# Patient Record
Sex: Female | Born: 1965 | Hispanic: No | Marital: Married | Smoking: Never smoker
Health system: Southern US, Community
[De-identification: ages and names within clinical notes are randomized; demographics above are authoritative.]

---

## 1995-09-04 HISTORY — PX: TUBAL LIGATION: SHX77

## 2019-11-13 ENCOUNTER — Ambulatory Visit: Payer: Self-pay | Attending: Internal Medicine

## 2019-11-13 DIAGNOSIS — Z23 Encounter for immunization: Secondary | ICD-10-CM

## 2019-11-13 NOTE — Progress Notes (Signed)
   Covid-19 Vaccination Clinic  Name:  Meagan Golden    MRN: [id number] DOB: 1966-03-20  11/13/2019  [name] was observed post Covid-19 immunization for 15 minutes without incident. She was provided with Vaccine Information Sheet and instruction to access the V-Safe system.   [name] was instructed to call 911 with any severe reactions post vaccine: Marland Kitchen Difficulty breathing  . Swelling of face and throat  . A fast heartbeat  . A bad rash all over body  . Dizziness and weakness   Immunizations Administered    Name Date Dose VIS Date Route   Moderna COVID-19 Vaccine 11/13/2019 11:05 AM 0.5 mL 08/04/2019 Intramuscular   Manufacturer: Moderna   Lot: [id number]   NDC: 46047-998-72

## 2019-12-16 ENCOUNTER — Ambulatory Visit: Payer: Self-pay

## 2019-12-23 ENCOUNTER — Ambulatory Visit: Payer: Self-pay | Attending: Internal Medicine

## 2019-12-23 ENCOUNTER — Ambulatory Visit: Payer: Self-pay

## 2019-12-23 DIAGNOSIS — Z23 Encounter for immunization: Secondary | ICD-10-CM

## 2019-12-23 NOTE — Progress Notes (Signed)
   Covid-19 Vaccination Clinic  Name:  Meagan Golden    MRN: [id number] DOB: Jan 24, 1966  12/23/2019  [name] was observed post Covid-19 immunization for 15 minutes without incident. She was provided with Vaccine Information Sheet and instruction to access the V-Safe system.   [name] was instructed to call 911 with any severe reactions post vaccine: Marland Kitchen Difficulty breathing  . Swelling of face and throat  . A fast heartbeat  . A bad rash all over body  . Dizziness and weakness   Immunizations Administered    Name Date Dose VIS Date Route   Moderna COVID-19 Vaccine 12/23/2019 11:44 AM 0.5 mL 08/2019 Intramuscular   Manufacturer: Moderna   Lot: [id number]   NDC: 97989-211-94

## 2020-07-06 ENCOUNTER — Ambulatory Visit (INDEPENDENT_AMBULATORY_CARE_PROVIDER_SITE_OTHER): Payer: Self-pay | Admitting: Internal Medicine

## 2020-07-07 ENCOUNTER — Other Ambulatory Visit: Payer: Self-pay

## 2020-07-07 ENCOUNTER — Ambulatory Visit (INDEPENDENT_AMBULATORY_CARE_PROVIDER_SITE_OTHER): Payer: 59 | Admitting: Internal Medicine

## 2020-07-07 ENCOUNTER — Encounter (INDEPENDENT_AMBULATORY_CARE_PROVIDER_SITE_OTHER): Payer: Self-pay | Admitting: Internal Medicine

## 2020-07-07 VITALS — BP 121/80 | HR 64 | Temp 97.9°F | Resp 19 | Ht 63.0 in | Wt 153.4 lb

## 2020-07-07 DIAGNOSIS — R5381 Other malaise: Secondary | ICD-10-CM | POA: Diagnosis not present

## 2020-07-07 DIAGNOSIS — Z13220 Encounter for screening for lipoid disorders: Secondary | ICD-10-CM

## 2020-07-07 DIAGNOSIS — Z124 Encounter for screening for malignant neoplasm of cervix: Secondary | ICD-10-CM

## 2020-07-07 DIAGNOSIS — Z131 Encounter for screening for diabetes mellitus: Secondary | ICD-10-CM

## 2020-07-07 DIAGNOSIS — Z1211 Encounter for screening for malignant neoplasm of colon: Secondary | ICD-10-CM

## 2020-07-07 DIAGNOSIS — Z13820 Encounter for screening for osteoporosis: Secondary | ICD-10-CM

## 2020-07-07 DIAGNOSIS — E559 Vitamin D deficiency, unspecified: Secondary | ICD-10-CM

## 2020-07-07 DIAGNOSIS — Z1231 Encounter for screening mammogram for malignant neoplasm of breast: Secondary | ICD-10-CM

## 2020-07-07 DIAGNOSIS — N951 Menopausal and female climacteric states: Secondary | ICD-10-CM

## 2020-07-07 DIAGNOSIS — R5383 Other fatigue: Secondary | ICD-10-CM

## 2020-07-07 NOTE — Progress Notes (Signed)
Metrics: Intervention Frequency ACO  Documented Smoking Status Yearly  Screened one or more times in 24 months  Cessation Counseling or  Active cessation medication Past 24 months  Past 24 months   Guideline developer: UpToDate (See UpToDate for funding source) Date Released: 2014       Wellness Office Visit  Subjective:  Patient ID: Meagan Golden, female    DOB: 02/13/1966  Age: 54 y.o. MRN: [id number]  CC: This 54 year old lady comes to our practice as a new patient to establish care. HPI  She describes fatigue, intermittent hot flashes. She also describes some vague dizziness which she cannot elaborate on.  She denies any chest pain or palpitations.  She is on no medications. Past Surgical History:  Procedure Laterality Date  . TUBAL LIGATION  1997     Family History  Problem Relation Age of Onset  . Diabetes Mother   . Heart disease Father   . Diabetes Brother     Social History   Social History Narrative   Married for 32 years.Lives with [employer].   Social History   Tobacco Use  . Smoking status: Never Smoker  . Smokeless tobacco: Never Used  Substance Use Topics  . Alcohol use: Never    No outpatient medications have been marked as taking for the 07/07/20 encounter (Office Visit) with Wilson Singer, MD.      No flowsheet data found.   Objective:   Today's Vitals: BP 121/80 (BP Location: Right Arm, Patient Position: Sitting, Cuff Size: Normal)   Pulse 64   Temp 97.9 F (36.6 C) (Temporal)   Resp 19   Ht 5\' 3"  (1.6 m)   Wt 153 lb 6.4 oz (69.6 kg)   SpO2 97%   BMI 27.17 kg/m  Vitals with BMI 07/07/2020  Height 5\' 3"   Weight 153 lbs 6 oz  BMI 27.18  Systolic 121  Diastolic 80  Pulse 64     Physical Exam  She is overweight.  Blood pressure is acceptable.  Alert and orientated without any obvious focal neurological signs.     Assessment   1. Malaise and fatigue   2. Vitamin D deficiency disease   3. Colon cancer  screening   4. Encounter for screening mammogram for malignant neoplasm of breast   5. Osteoporosis screening   6. Screening for diabetes mellitus   7. Screening for lipoid disorders   8. Hot flashes due to menopause   9. Cervical cancer screening       Tests ordered Orders Placed This Encounter  Procedures  . MM 3D SCREEN BREAST BILATERAL  . DG Bone Density  . CBC  . COMPLETE METABOLIC PANEL WITH GFR  . Hemoglobin A1c  . Lipid panel  . Progesterone  . Estradiol  . T3, free  . T4  . TSH  . VITAMIN D 25 Hydroxy (Vit-D Deficiency, Fractures)  . Ambulatory referral to Gastroenterology  . Ambulatory referral to Obstetrics / Gynecology     Plan: 1. Blood work is ordered. 2. I will arrange for her to have a mammogram, bone density scan and colonoscopy as well as Pap smear. 3. Follow-up in a few weeks time to discuss all these results.   No orders of the defined types were placed in this encounter.   13/12/2019, MD

## 2020-07-08 LAB — COMPLETE METABOLIC PANEL WITH GFR
AG Ratio: 1.5 (calc) (ref 1.0–2.5)
ALT: 24 U/L (ref 6–29)
AST: 23 U/L (ref 10–35)
Albumin: 4.7 g/dL (ref 3.6–5.1)
Alkaline phosphatase (APISO): 85 U/L (ref 37–153)
BUN: 9 mg/dL (ref 7–25)
CO2: 27 mmol/L (ref 20–32)
Calcium: 10 mg/dL (ref 8.6–10.4)
Chloride: 102 mmol/L (ref 98–110)
Creat: 0.8 mg/dL (ref 0.50–1.05)
GFR, Est African American: 96 mL/min/{1.73_m2} (ref >=60)
GFR, Est Non African American: 83 mL/min/{1.73_m2} (ref >=60)
Globulin: 3.1 g/dL (calc) (ref 1.9–3.7)
Glucose, Bld: 90 mg/dL (ref 65–99)
Potassium: 4.6 mmol/L (ref 3.5–5.3)
Sodium: 139 mmol/L (ref 135–146)
Total Bilirubin: 0.4 mg/dL (ref 0.2–1.2)
Total Protein: 7.8 g/dL (ref 6.1–8.1)

## 2020-07-08 LAB — CBC
HCT: 40.4 % (ref 35.0–45.0)
Hemoglobin: 13.7 g/dL (ref 11.7–15.5)
MCH: 30.2 pg (ref 27.0–33.0)
MCHC: 33.9 g/dL (ref 32.0–36.0)
MCV: 89 fL (ref 80.0–100.0)
MPV: 11.9 fL (ref 7.5–12.5)
Platelets: 293 10*3/uL (ref 140–400)
RBC: 4.54 10*6/uL (ref 3.80–5.10)
RDW: 12.3 % (ref 11.0–15.0)
WBC: 8 10*3/uL (ref 3.8–10.8)

## 2020-07-08 LAB — LIPID PANEL
Cholesterol: 249 mg/dL — ABNORMAL HIGH (ref <200)
HDL: 34 mg/dL — ABNORMAL LOW (ref >=50)
LDL Cholesterol (Calc): 161 mg/dL (calc) — ABNORMAL HIGH
Non-HDL Cholesterol (Calc): 215 mg/dL (calc) — ABNORMAL HIGH (ref <130)
Total CHOL/HDL Ratio: 7.3 (calc) — ABNORMAL HIGH (ref <5.0)
Triglycerides: 353 mg/dL — ABNORMAL HIGH (ref <150)

## 2020-07-08 LAB — PROGESTERONE: Progesterone: <0.5 ng/mL

## 2020-07-08 LAB — HEMOGLOBIN A1C
Hgb A1c MFr Bld: 6.4 % of total Hgb — ABNORMAL HIGH (ref <5.7)
Mean Plasma Glucose: 137 (calc)
eAG (mmol/L): 7.6 (calc)

## 2020-07-08 LAB — TSH: TSH: 4.1 mIU/L

## 2020-07-08 LAB — T3, FREE: T3, Free: 3 pg/mL (ref 2.3–4.2)

## 2020-07-08 LAB — VITAMIN D 25 HYDROXY (VIT D DEFICIENCY, FRACTURES): Vit D, 25-Hydroxy: 26 ng/mL — ABNORMAL LOW (ref 30–100)

## 2020-07-08 LAB — T4: T4, Total: 9.6 ug/dL (ref 5.1–11.9)

## 2020-07-08 LAB — ESTRADIOL: Estradiol: <15 pg/mL

## 2020-07-19 ENCOUNTER — Encounter: Payer: Self-pay | Admitting: Internal Medicine

## 2020-08-22 ENCOUNTER — Ambulatory Visit (INDEPENDENT_AMBULATORY_CARE_PROVIDER_SITE_OTHER): Payer: 59 | Admitting: Internal Medicine

## 2020-08-22 ENCOUNTER — Telehealth (INDEPENDENT_AMBULATORY_CARE_PROVIDER_SITE_OTHER): Payer: 59 | Admitting: Internal Medicine

## 2020-09-01 ENCOUNTER — Other Ambulatory Visit: Payer: Self-pay

## 2020-09-01 ENCOUNTER — Encounter: Payer: Self-pay | Admitting: Internal Medicine

## 2020-09-01 ENCOUNTER — Ambulatory Visit: Payer: 59

## 2020-09-06 ENCOUNTER — Other Ambulatory Visit: Payer: Self-pay

## 2020-09-06 ENCOUNTER — Other Ambulatory Visit (HOSPITAL_COMMUNITY)
Admission: RE | Admit: 2020-09-06 | Discharge: 2020-09-06 | Disposition: A | Discharge status: Home / Self Care | Payer: 59 | Source: Ambulatory Visit | Attending: Adult Health | Admitting: Adult Health

## 2020-09-06 ENCOUNTER — Encounter: Payer: Self-pay | Admitting: Adult Health

## 2020-09-06 ENCOUNTER — Ambulatory Visit (INDEPENDENT_AMBULATORY_CARE_PROVIDER_SITE_OTHER): Payer: 59 | Admitting: Adult Health

## 2020-09-06 VITALS — BP 113/73 | HR 89 | Ht 63.0 in | Wt 155.0 lb

## 2020-09-06 DIAGNOSIS — Z1211 Encounter for screening for malignant neoplasm of colon: Secondary | ICD-10-CM | POA: Insufficient documentation

## 2020-09-06 DIAGNOSIS — N816 Rectocele: Secondary | ICD-10-CM | POA: Diagnosis not present

## 2020-09-06 DIAGNOSIS — Z01419 Encounter for gynecological examination (general) (routine) without abnormal findings: Secondary | ICD-10-CM | POA: Insufficient documentation

## 2020-09-06 DIAGNOSIS — R140 Abdominal distension (gaseous): Secondary | ICD-10-CM | POA: Diagnosis not present

## 2020-09-06 LAB — HEMOCCULT GUIAC POC 1CARD (OFFICE): Fecal Occult Blood, POC: NEGATIVE

## 2020-09-06 NOTE — Progress Notes (Signed)
Patient ID: Meagan Golden, female   DOB: 01/02/1966, 55 y.o.   MRN: [id number] History of Present Illness: Meagan Golden is a 55 year old [place] female, married, PM in for a pelvic and pap. She had labs and physical with PCP. Her daughter is with her. PCP is Dr Karilyn Cota.   Current Medications, Allergies, Past Medical History, Past Surgical History, Family History and Social History were reviewed in [employer] record.     Review of Systems: Patient denies any abdominal pain, problems with bowel movements, urination, or intercourse,or vaginal bleeding since stopping period. No joint pain or mood swings. +bloating,thinks stomach feels bigger below the navel   Physical Exam:BP 113/73 (BP Location: Left Arm, Patient Position: Sitting, Cuff Size: Normal)   Pulse 89   Ht 5\' 3"  (1.6 m)   Wt 155 lb (70.3 kg)   BMI 27.46 kg/m  General:  Well developed, well nourished, no acute distress Skin:  Warm and dry Lungs; Clear to auscultation bilaterally Cardiovascular: Regular rate and rhythm Pelvic:  External genitalia is normal in appearance, no lesions.  The vagina is pale with loss of moisture and rugae. Urethra has no lesions or masses. The cervix is smooth, pap with HRHPV genotyping performed. Uterus is felt to be normal size, shape, and contour.  No adnexal masses or tenderness noted.Bladder is non tender, no masses felt. Rectal: Good sphincter tone, no polyps, or hemorrhoids felt.  Hemoccult negative.+rectocele Extremities/musculoskeletal:  No swelling or varicosities noted, no clubbing or cyanosis Psych:  No mood changes, alert and cooperative,seems happy AA is 0  Fall risk is low PHQ 9 score is 1 GAD 7 score is 0  Upstream - 09/06/20 1447      Pregnancy Intention Screening   Does the patient want to become pregnant in the next year? N/A    Does the patient's partner want to become pregnant in the next year? N/A    Would the patient like to discuss contraceptive options  today? N/A      Contraception Wrap Up   Current Method Female Sterilization   PM   End Method Female Sterilization   PM   Contraception Counseling Provided No         Examination chaperoned by 11/04/20 LPN  Impression and Plan: 1. Encounter for gynecological examination with Papanicolaou smear of cervix Pap sent Pap in 3 years if normal Physical and labs with PCP Scheduled mammogram for her 09/19/20 at 6:15 pm at [facility] Call PCP regarding labs  2. Encounter for screening fecal occult blood testing  3. Bloating Will get pelvic [facility] to assess bloating, 09/19/20 at 3:30 pm at [facility]   4. Rectocele

## 2020-09-07 ENCOUNTER — Encounter (INDEPENDENT_AMBULATORY_CARE_PROVIDER_SITE_OTHER): Payer: Self-pay | Admitting: Internal Medicine

## 2020-09-07 ENCOUNTER — Ambulatory Visit (INDEPENDENT_AMBULATORY_CARE_PROVIDER_SITE_OTHER): Payer: 59 | Admitting: Internal Medicine

## 2020-09-07 VITALS — BP 120/74 | HR 86 | Temp 97.3°F | Ht 63.0 in | Wt 156.8 lb

## 2020-09-07 DIAGNOSIS — R5381 Other malaise: Secondary | ICD-10-CM | POA: Diagnosis not present

## 2020-09-07 DIAGNOSIS — R5383 Other fatigue: Secondary | ICD-10-CM

## 2020-09-07 DIAGNOSIS — R7303 Prediabetes: Secondary | ICD-10-CM | POA: Diagnosis not present

## 2020-09-07 DIAGNOSIS — E785 Hyperlipidemia, unspecified: Secondary | ICD-10-CM | POA: Diagnosis not present

## 2020-09-07 DIAGNOSIS — E559 Vitamin D deficiency, unspecified: Secondary | ICD-10-CM

## 2020-09-07 MED ORDER — NP THYROID 60 MG PO TABS: 60.0000 mg | ORAL_TABLET | Freq: Every day | ORAL | 3 refills | Status: DC

## 2020-09-07 NOTE — Progress Notes (Signed)
Metrics: Intervention Frequency ACO  Documented Smoking Status Yearly  Screened one or more times in 24 months  Cessation Counseling or  Active cessation medication Past 24 months  Past 24 months   Guideline developer: UpToDate (See UpToDate for funding source) Date Released: 2014       Wellness Office Visit  Subjective:  Patient ID: Meagan Golden, female    DOB: 26-May-1966  Age: 55 y.o. MRN: [id number]  CC: This lady comes in for follow-up regarding her blood work and further recommendations. HPI  I discussed all her blood results with her.  She has significant vitamin D deficiency.  She is prediabetic with a hemoglobin A1c of 6.4%.  She also has dyslipidemia with elevated cholesterol and triglycerides.  As expected estradiol and progesterone levels are undetectable.  Also her TSH is elevated above 4, although technically in the normal range.  She does appear to have some symptoms of thyroid hypofunction. History reviewed. No pertinent past medical history. Past Surgical History:  Procedure Laterality Date  . TUBAL LIGATION  1997     Family History  Problem Relation Age of Onset  . Diabetes Mother   . Heart disease Father   . Diabetes Brother     Social History   Social History Narrative   Married for 32 years.Lives with [employer].   Social History   Tobacco Use  . Smoking status: Never Smoker  . Smokeless tobacco: Never Used  Substance Use Topics  . Alcohol use: Never    Current Meds  Medication Sig  . NP THYROID 60 MG tablet Take 1 tablet (60 mg total) by mouth daily before breakfast.      Depression screen [facility] 2/9 09/06/2020  Decreased Interest 0  Down, Depressed, Hopeless 0  PHQ - 2 Score 0  Altered sleeping 0  Tired, decreased energy 0  Change in appetite 1  Feeling bad or failure about yourself  0  Trouble concentrating 0  Moving slowly or fidgety/restless 0  Suicidal thoughts 0  PHQ-9 Score 1     Objective:   Today's Vitals: BP  120/74 (BP Location: Left Arm, Patient Position: Sitting, Cuff Size: Normal)   Pulse 86   Temp (!) 97.3 F (36.3 C) (Temporal)   Ht 5\' 3"  (1.6 m)   Wt 156 lb 12.8 oz (71.1 kg)   SpO2 98%   BMI 27.78 kg/m  Vitals with BMI 09/07/2020 09/06/2020 07/07/2020  Height 5\' 3"  5\' 3"  5\' 3"   Weight 156 lbs 13 oz 155 lbs 153 lbs 6 oz  BMI 27.78 27.46 27.18  Systolic 120 113 13/12/2019  Diastolic 74 73 80  Pulse 86 89 64     Physical Exam   She looks systemically well.  No new physical findings today.    Assessment   1. Vitamin D deficiency disease   2. Malaise and fatigue   3. Prediabetes   4. Dyslipidemia       Tests ordered No orders of the defined types were placed in this encounter.    Plan: 1. I recommend that she start taking vitamin D3 10,000 units daily. 2. After reviewing her symptoms, with shared decision making, I am going to prescribe for her desiccated NP thyroid, off label, for symptoms of thyroid hypofunction. 3. We discussed nutrition in detail to prevent her becoming diabetes and also helping her dyslipidemia by reducing visceral fat.  I discussed the concept of intermittent fasting combined with a plant-based diet.  Fortunately, she does not eat any animal  protein whatsoever and this will make it somewhat easier for her to follow guidelines.  In her diet there is a lot of white rice and white flour which she needs to reduce also. 4. Follow-up in about 2 months to see how she is doing and we will repeat some blood work.   Meds ordered this encounter  Medications  . NP THYROID 60 MG tablet    Sig: Take 1 tablet (60 mg total) by mouth daily before breakfast.    Dispense:  30 tablet    Refill:  3    Marchetta Navratil Normajean Glasgow, MD

## 2020-09-07 NOTE — Patient Instructions (Signed)
[name] Dietary Recommendations for Weight Loss What to Avoid . Avoid added sugars o Often added sugar can be found in processed foods such as many condiments, dry cereals, cakes, cookies, chips, crisps, crackers, candies, sweetened drinks, etc.  o Read labels and AVOID/DECREASE use of foods with the following in their ingredient list: Sugar, fructose, high fructose corn syrup, sucrose, glucose, maltose, dextrose, molasses, cane sugar, brown sugar, any type of syrup, agave nectar, etc.   . Avoid snacking in between meals . Avoid foods made with flour o If you are going to eat food made with flour, choose those made with whole-grains; and, minimize your consumption as much as is tolerable . Avoid processed foods o These foods are generally stocked in the middle of the grocery store. Focus on shopping on the perimeter of the grocery.  . Avoid Meat  o We recommend following a plant-based diet at [facility]. Thus, we recommend avoiding meat as a general rule. Consider eating beans, legumes, eggs, and/or dairy products for regular protein sources o If you plan on eating meat limit to 4 ounces of meat at a time and choose lean options such as Fish, chicken, turkey. Avoid red meat intake such as pork and/or steak What to Include . Vegetables o GREEN LEAFY VEGETABLES: Kale, spinach, mustard greens, collard greens, cabbage, broccoli, etc. o OTHER: Asparagus, cauliflower, eggplant, carrots, peas, Brussel sprouts, tomatoes, bell peppers, zucchini, beets, cucumbers, etc. . Grains, seeds, and legumes o Beans: kidney beans, black eyed peas, garbanzo beans, black beans, pinto beans, etc. o Whole, unrefined grains: brown rice, barley, bulgur, oatmeal, etc. . Healthy fats  o Avoid highly processed fats such as vegetable oil o Examples of healthy fats: avocado, olives, virgin olive oil, dark chocolate (?72% Cocoa), nuts (peanuts, almonds, walnuts, cashews, pecans, etc.) . None to Low  Intake of Animal Sources of Protein o Meat sources: chicken, turkey, salmon, tuna. Limit to 4 ounces of meat at one time. o Consider limiting dairy sources, but when choosing dairy focus on: PLAIN Greek yogurt, cottage cheese, high-protein milk . Fruit o Choose berries  When to Eat . Intermittent Fasting: o Choosing not to eat for a specific time period, but DO FOCUS ON HYDRATION when fasting o Multiple Techniques: - Time Restricted Eating: eat 3 meals in a day, each meal lasting no more than 60 minutes, no snacks between meals - 16-18 hour fast: fast for 16 to 18 hours up to 7 days a week. Often suggested to start with 2-3 nonconsecutive days per week.  . Remember the time you sleep is counted as fasting.  . Examples of eating schedule: Fast from 7:00pm-11:00am. Eat between 11:00am-7:00pm.  - 24-hour fast: fast for 24 hours up to every other day. Often suggested to start with 1 day per week . Remember the time you sleep is counted as fasting . Examples of eating schedule:  o Eating day: eat 2-3 meals on your eating day. If doing 2 meals, each meal should last no more than 90 minutes. If doing 3 meals, each meal should last no more than 60 minutes. Finish last meal by 7:00pm. o Fasting day: Fast until 7:00pm.  o IF YOU FEEL UNWELL FOR ANY REASON/IN ANY WAY WHEN FASTING, STOP FASTING BY EATING A NUTRITIOUS SNACK OR LIGHT MEAL o ALWAYS FOCUS ON HYDRATION DURING FASTS - Acceptable Hydration sources: water, broths, tea/coffee (black tea/coffee is best but using a small amount of whole-fat dairy products in coffee/tea is acceptable).  -   Poor Hydration Sources: anything with sugar or artificial sweeteners added to it  These recommendations have been developed for patients that are actively receiving medical care from either Dr. Adonna Horsley or Sarah Gray, DNP, NP-C at [facility]. These recommendations are developed for patients with specific medical conditions and are not meant to be  distributed or used by others that are not actively receiving care from either provider listed above at [facility]. It is not appropriate to participate in the above eating plans without proper medical supervision.   Reference: Fung, J. The obesity code. [place]: [place]; 2016.   

## 2020-09-11 LAB — CYTOLOGY - PAP
Comment: NEGATIVE
Diagnosis: NEGATIVE
High risk HPV: NEGATIVE

## 2020-09-19 ENCOUNTER — Ambulatory Visit (HOSPITAL_COMMUNITY)
Admission: RE | Admit: 2020-09-19 | Discharge: 2020-09-19 | Disposition: A | Discharge status: Home / Self Care | Payer: 59 | Source: Ambulatory Visit | Attending: Internal Medicine | Admitting: Internal Medicine

## 2020-09-19 ENCOUNTER — Ambulatory Visit (HOSPITAL_COMMUNITY): Payer: 59

## 2020-09-19 ENCOUNTER — Ambulatory Visit (HOSPITAL_COMMUNITY)
Admission: RE | Admit: 2020-09-19 | Discharge: 2020-09-19 | Disposition: A | Discharge status: Home / Self Care | Payer: 59 | Source: Ambulatory Visit | Attending: Adult Health | Admitting: Adult Health

## 2020-09-19 ENCOUNTER — Other Ambulatory Visit: Payer: Self-pay

## 2020-09-19 DIAGNOSIS — R140 Abdominal distension (gaseous): Secondary | ICD-10-CM | POA: Diagnosis present

## 2020-09-19 DIAGNOSIS — Z1231 Encounter for screening mammogram for malignant neoplasm of breast: Secondary | ICD-10-CM | POA: Diagnosis present

## 2020-09-29 ENCOUNTER — Telehealth (INDEPENDENT_AMBULATORY_CARE_PROVIDER_SITE_OTHER): Payer: Self-pay

## 2020-09-29 NOTE — Telephone Encounter (Signed)
He is talking about NP thyroid I presume.  The patient should stop the medication until the next visit.

## 2020-09-29 NOTE — Telephone Encounter (Signed)
RETURN CALL TO SON, GIVEN INSTRUCTIONS TO STOP MEDICATIONS. ASK TO RESCHEDULE A APPT TO COME SEE HIM SOONER. MOVED APPT TO 10/12/20 @ 1PM.

## 2020-10-12 ENCOUNTER — Ambulatory Visit (INDEPENDENT_AMBULATORY_CARE_PROVIDER_SITE_OTHER): Payer: 59 | Admitting: Internal Medicine

## 2020-11-07 ENCOUNTER — Encounter (INDEPENDENT_AMBULATORY_CARE_PROVIDER_SITE_OTHER): Payer: Self-pay | Admitting: Internal Medicine

## 2020-11-07 ENCOUNTER — Ambulatory Visit (INDEPENDENT_AMBULATORY_CARE_PROVIDER_SITE_OTHER): Payer: 59 | Admitting: Internal Medicine

## 2020-11-07 ENCOUNTER — Other Ambulatory Visit: Payer: Self-pay

## 2020-11-07 VITALS — BP 118/76 | HR 87 | Temp 97.5°F | Ht 63.0 in | Wt 140.2 lb

## 2020-11-07 DIAGNOSIS — E785 Hyperlipidemia, unspecified: Secondary | ICD-10-CM | POA: Diagnosis not present

## 2020-11-07 DIAGNOSIS — R7303 Prediabetes: Secondary | ICD-10-CM

## 2020-11-07 DIAGNOSIS — E559 Vitamin D deficiency, unspecified: Secondary | ICD-10-CM | POA: Diagnosis not present

## 2020-11-07 NOTE — Progress Notes (Signed)
Metrics: Intervention Frequency ACO  Documented Smoking Status Yearly  Screened one or more times in 24 months  Cessation Counseling or  Active cessation medication Past 24 months  Past 24 months   Guideline developer: UpToDate (See UpToDate for funding source) Date Released: 2014       Wellness Office Visit  Subjective:  Patient ID: Meagan Golden, female    DOB: [birth year]  Age: 55 y.o. MRN: [id number]  CC: This lady comes in for follow-up of prediabetes, dyslipidemia, vitamin D deficiency and thyroid hypofunction. HPI  She has done extremely well with nutritional changes.  She is eating healthier and has a result has lost approximately 15 pounds. She has tolerated NP thyroid 60 mg daily which is being used off label for symptoms of thyroid hypofunction. She has been taking vitamin D3 10,000 units daily for vitamin D deficiency. History reviewed. No pertinent past medical history. Past Surgical History:  Procedure Laterality Date  . TUBAL LIGATION  1997     Family History  Problem Relation Age of Onset  . Diabetes Mother   . Heart disease Father   . Diabetes Brother     Social History   Social History Narrative   Married for 32 years.Lives with [employer].   Social History   Tobacco Use  . Smoking status: Never Smoker  . Smokeless tobacco: Never Used  Substance Use Topics  . Alcohol use: Never    Current Meds  Medication Sig  . Cholecalciferol (VITAMIN D3) 125 MCG (5000 UT) TABS Take 2 tablets by mouth daily at 12 noon.  . NP THYROID 60 MG tablet Take 1 tablet (60 mg total) by mouth daily before breakfast.     Flowsheet Row Office Visit from 11/07/2020 in [facility]  PHQ-9 Total Score 0      Objective:   Today's Vitals: BP 118/76   Pulse 87   Temp (!) 97.5 F (36.4 C) (Temporal)   Ht 5\' 3"  (1.6 m)   Wt 140 lb 3.2 oz (63.6 kg)   SpO2 97%   BMI 24.84 kg/m  Vitals with BMI 11/07/2020 09/07/2020 09/06/2020  Height 5\' 3"  5\' 3"  5\' 3"    Weight 140 lbs 3 oz 156 lbs 13 oz 155 lbs  BMI 24.84 27.78 27.46  Systolic 118 120 11/04/2020  Diastolic 76 74 73  Pulse 87 86 89     Physical Exam She looks systemically well and has lost 16 pounds since last visit.  This is excellent.      Assessment   1. Prediabetes   2. Dyslipidemia   3. Vitamin D deficiency disease       Tests ordered Orders Placed This Encounter  Procedures  . COMPLETE METABOLIC PANEL WITH GFR  . Hemoglobin A1c  . Lipid panel  . VITAMIN D 25 Hydroxy (Vit-D Deficiency, Fractures)  . T3, free  . TSH     Plan: 1. She will continue with NP thyroid 60 mg daily which she is tolerating well.  Check thyroid function today. 2. She will continue with vitamin D3 10,000 units daily and we will check vitamin D levels today. 3. She will continue with present nutrition and I will check electrolytes, lipid panel and hemoglobin A1c. 4. Follow-up in 3 months and further recommendations will depend on blood results.   No orders of the defined types were placed in this encounter.   , MD

## 2020-11-08 LAB — LIPID PANEL
Cholesterol: 179 mg/dL (ref <200)
HDL: 36 mg/dL — ABNORMAL LOW (ref >=50)
LDL Cholesterol (Calc): 116 mg/dL (calc) — ABNORMAL HIGH
Non-HDL Cholesterol (Calc): 143 mg/dL (calc) — ABNORMAL HIGH (ref <130)
Total CHOL/HDL Ratio: 5 (calc) — ABNORMAL HIGH (ref <5.0)
Triglycerides: 152 mg/dL — ABNORMAL HIGH (ref <150)

## 2020-11-08 LAB — COMPLETE METABOLIC PANEL WITH GFR
AG Ratio: 1.6 (calc) (ref 1.0–2.5)
ALT: 25 U/L (ref 6–29)
AST: 18 U/L (ref 10–35)
Albumin: 4.3 g/dL (ref 3.6–5.1)
Alkaline phosphatase (APISO): 72 U/L (ref 37–153)
BUN: 13 mg/dL (ref 7–25)
CO2: 29 mmol/L (ref 20–32)
Calcium: 9.8 mg/dL (ref 8.6–10.4)
Chloride: 105 mmol/L (ref 98–110)
Creat: 0.78 mg/dL (ref 0.50–1.05)
GFR, Est African American: 99 mL/min/{1.73_m2} (ref >=60)
GFR, Est Non African American: 86 mL/min/{1.73_m2} (ref >=60)
Globulin: 2.7 g/dL (calc) (ref 1.9–3.7)
Glucose, Bld: 100 mg/dL (ref 65–139)
Potassium: 4.7 mmol/L (ref 3.5–5.3)
Sodium: 141 mmol/L (ref 135–146)
Total Bilirubin: 0.4 mg/dL (ref 0.2–1.2)
Total Protein: 7 g/dL (ref 6.1–8.1)

## 2020-11-08 LAB — T3, FREE: T3, Free: 6.8 pg/mL — ABNORMAL HIGH (ref 2.3–4.2)

## 2020-11-08 LAB — VITAMIN D 25 HYDROXY (VIT D DEFICIENCY, FRACTURES): Vit D, 25-Hydroxy: 79 ng/mL (ref 30–100)

## 2020-11-08 LAB — TSH: TSH: 0.01 mIU/L — ABNORMAL LOW

## 2020-11-08 LAB — HEMOGLOBIN A1C
Hgb A1c MFr Bld: 5.9 % of total Hgb — ABNORMAL HIGH (ref <5.7)
Mean Plasma Glucose: 123 mg/dL
eAG (mmol/L): 6.8 mmol/L

## 2020-11-08 NOTE — Progress Notes (Signed)
Please call the patient's son, who lives in [place] and his number is listed and let him know that all the blood work results are much better and she should continue on the same medications.  Her prediabetes is definitely improving now and her cholesterol has also dramatically improved by her losing weight.  Her thyroid levels are in a good range.  Her vitamin D levels are also very good now.  Continue on the same dose of vitamin D3. Follow-up as scheduled.

## 2020-12-28 ENCOUNTER — Other Ambulatory Visit (INDEPENDENT_AMBULATORY_CARE_PROVIDER_SITE_OTHER): Payer: Self-pay | Admitting: Internal Medicine

## 2021-02-13 ENCOUNTER — Ambulatory Visit (INDEPENDENT_AMBULATORY_CARE_PROVIDER_SITE_OTHER): Payer: 59 | Admitting: Internal Medicine

## 2021-02-13 ENCOUNTER — Encounter (INDEPENDENT_AMBULATORY_CARE_PROVIDER_SITE_OTHER): Payer: Self-pay | Admitting: Internal Medicine

## 2021-02-13 ENCOUNTER — Other Ambulatory Visit: Payer: Self-pay

## 2021-02-13 VITALS — BP 118/70 | HR 90 | Temp 97.9°F | Ht 63.0 in | Wt 130.4 lb

## 2021-02-13 DIAGNOSIS — R7303 Prediabetes: Secondary | ICD-10-CM | POA: Diagnosis not present

## 2021-02-13 DIAGNOSIS — E785 Hyperlipidemia, unspecified: Secondary | ICD-10-CM | POA: Diagnosis not present

## 2021-02-13 DIAGNOSIS — R000 Tachycardia, unspecified: Secondary | ICD-10-CM | POA: Diagnosis not present

## 2021-02-13 DIAGNOSIS — R5381 Other malaise: Secondary | ICD-10-CM | POA: Diagnosis not present

## 2021-02-13 DIAGNOSIS — R5383 Other fatigue: Secondary | ICD-10-CM

## 2021-02-13 MED ORDER — NP THYROID 60 MG PO TABS: 60.0000 mg | ORAL_TABLET | Freq: Every day | ORAL | 0 refills | Status: AC

## 2021-02-13 NOTE — Progress Notes (Signed)
Eg Metrics: Intervention Frequency ACO  Documented Smoking Status Yearly  Screened one or more times in 24 months  Cessation Counseling or  Active cessation medication Past 24 months  Past 24 months   Guideline developer: UpToDate (See UpToDate for funding source) Date Released: 2014       Wellness Office Visit  Subjective:  Patient ID: Meagan Golden, female    DOB: [birth year]  Age: 55 y.o. MRN: [id number]  CC: This lady comes in for follow-up of prediabetes, dyslipidemia, symptoms of thyroid hypofunction. HPI  She says she feels very well and has lost significant amount of weight.  She does describe episodes of dizziness/lightheadedness. She continues on NP thyroid 60 mg daily. History reviewed. No pertinent past medical history. Past Surgical History:  Procedure Laterality Date   TUBAL LIGATION  1997     Family History  Problem Relation Age of Onset   Diabetes Mother    Heart disease Father    Diabetes Brother     Social History   Social History Narrative   Married for 32 years.Lives with [employer].   Social History   Tobacco Use   Smoking status: Never   Smokeless tobacco: Never  Substance Use Topics   Alcohol use: Never    Current Meds  Medication Sig   Cholecalciferol (VITAMIN D3) 125 MCG (5000 UT) TABS Take 2 tablets by mouth daily at 12 noon.   [DISCONTINUED] NP THYROID 60 MG tablet TAKE 1 TABLET(60 MG) BY MOUTH DAILY BEFORE AND BREAKFAST     Flowsheet Row Office Visit from 02/13/2021 in [facility]  PHQ-9 Total Score 0       Objective:   Today's Vitals: BP 118/70   Pulse 90   Temp 97.9 F (36.6 C) (Temporal)   Ht 5\' 3"  (1.6 m)   Wt 130 lb 6.4 oz (59.1 kg)   SpO2 98%   BMI 23.10 kg/m  Vitals with BMI 02/13/2021 11/07/2020 09/07/2020  Height 5\' 3"  5\' 3"  5\' 3"   Weight 130 lbs 6 oz 140 lbs 3 oz 156 lbs 13 oz  BMI 23.11 24.84 27.78  Systolic 118 118 11/05/2020  Diastolic 70 76 74  Pulse 90 87 86     Physical Exam  She  appears to have a resting tachycardia.  She has lost a further 10 pounds since last time.     Assessment   1. Dyslipidemia   2. Prediabetes   3. Malaise and fatigue   4. Tachycardia       Tests ordered Orders Placed This Encounter  Procedures   COMPLETE METABOLIC PANEL WITH GFR   T3, free   TSH   Hemoglobin A1c   Lipid panel   EKG 12-Lead      Plan: 1.  An ECG was done in the office due to her resting tachycardia which showed normal sinus rhythm without any acute ST-T wave changes. 2.  Continue with NP thyroid and I have sent a refill of 90 days.  We will check thyroid function. 3.  She informed me that she will no longer living in this area and will be moving away.  I have told her that I will let her know if the blood work is okay and I wish her all the best.    Meds ordered this encounter  Medications   NP THYROID 60 MG tablet    Sig: Take 1 tablet (60 mg total) by mouth daily before breakfast.    Dispense:  90 tablet  Refill:  0     Martina Brodbeck Normajean Glasgow, MD

## 2021-02-14 ENCOUNTER — Telehealth (INDEPENDENT_AMBULATORY_CARE_PROVIDER_SITE_OTHER): Payer: Self-pay

## 2021-02-14 LAB — COMPLETE METABOLIC PANEL WITH GFR
AG Ratio: 1.5 (calc) (ref 1.0–2.5)
ALT: 16 U/L (ref 6–29)
AST: 16 U/L (ref 10–35)
Albumin: 4.2 g/dL (ref 3.6–5.1)
Alkaline phosphatase (APISO): 74 U/L (ref 37–153)
BUN: 12 mg/dL (ref 7–25)
CO2: 29 mmol/L (ref 20–32)
Calcium: 9.7 mg/dL (ref 8.6–10.4)
Chloride: 106 mmol/L (ref 98–110)
Creat: 0.71 mg/dL (ref 0.50–1.05)
GFR, Est African American: 110 mL/min/{1.73_m2} (ref >=60)
GFR, Est Non African American: 95 mL/min/{1.73_m2} (ref >=60)
Globulin: 2.8 g/dL (calc) (ref 1.9–3.7)
Glucose, Bld: 156 mg/dL — ABNORMAL HIGH (ref 65–139)
Potassium: 4.3 mmol/L (ref 3.5–5.3)
Sodium: 142 mmol/L (ref 135–146)
Total Bilirubin: 0.4 mg/dL (ref 0.2–1.2)
Total Protein: 7 g/dL (ref 6.1–8.1)

## 2021-02-14 LAB — HEMOGLOBIN A1C
Hgb A1c MFr Bld: 5.9 % of total Hgb — ABNORMAL HIGH (ref <5.7)
Mean Plasma Glucose: 123 mg/dL
eAG (mmol/L): 6.8 mmol/L

## 2021-02-14 LAB — LIPID PANEL
Cholesterol: 175 mg/dL (ref <200)
HDL: 37 mg/dL — ABNORMAL LOW (ref >=50)
LDL Cholesterol (Calc): 108 mg/dL (calc) — ABNORMAL HIGH
Non-HDL Cholesterol (Calc): 138 mg/dL (calc) — ABNORMAL HIGH (ref <130)
Total CHOL/HDL Ratio: 4.7 (calc) (ref <5.0)
Triglycerides: 186 mg/dL — ABNORMAL HIGH (ref <150)

## 2021-02-14 LAB — TSH: TSH: <0.01 mIU/L — ABNORMAL LOW (ref 0.40–4.50)

## 2021-02-14 LAB — T3, FREE: T3, Free: 5.8 pg/mL — ABNORMAL HIGH (ref 2.3–4.2)

## 2021-02-14 NOTE — Telephone Encounter (Signed)
-----   Message from Wilson Singer, MD sent at 02/14/2021  8:00 AM EDT ----- Let the patient or the patient's son know that the blood tests are very stable and improving.  Continue with all the medications.  She is going to be leaving this area so she will have to find another doctor elsewhere.

## 2021-02-14 NOTE — Progress Notes (Signed)
Let the patient or the patient's son know that the blood tests are very stable and improving.  Continue with all the medications.  She is going to be leaving this area so she will have to find another doctor elsewhere.

## 2021-04-06 ENCOUNTER — Encounter (INDEPENDENT_AMBULATORY_CARE_PROVIDER_SITE_OTHER): Payer: Self-pay

## 2022-08-28 IMAGING — MG DIGITAL SCREENING BILAT W/ TOMO W/ CAD
6 of 10 series · 6 of 30 positions shown · non-contrast
Comparison: None.

CLINICAL DATA: Screening.

EXAM:
DIGITAL SCREENING BILATERAL MAMMOGRAM WITH TOMO AND CAD

[R MLO synth-2D]
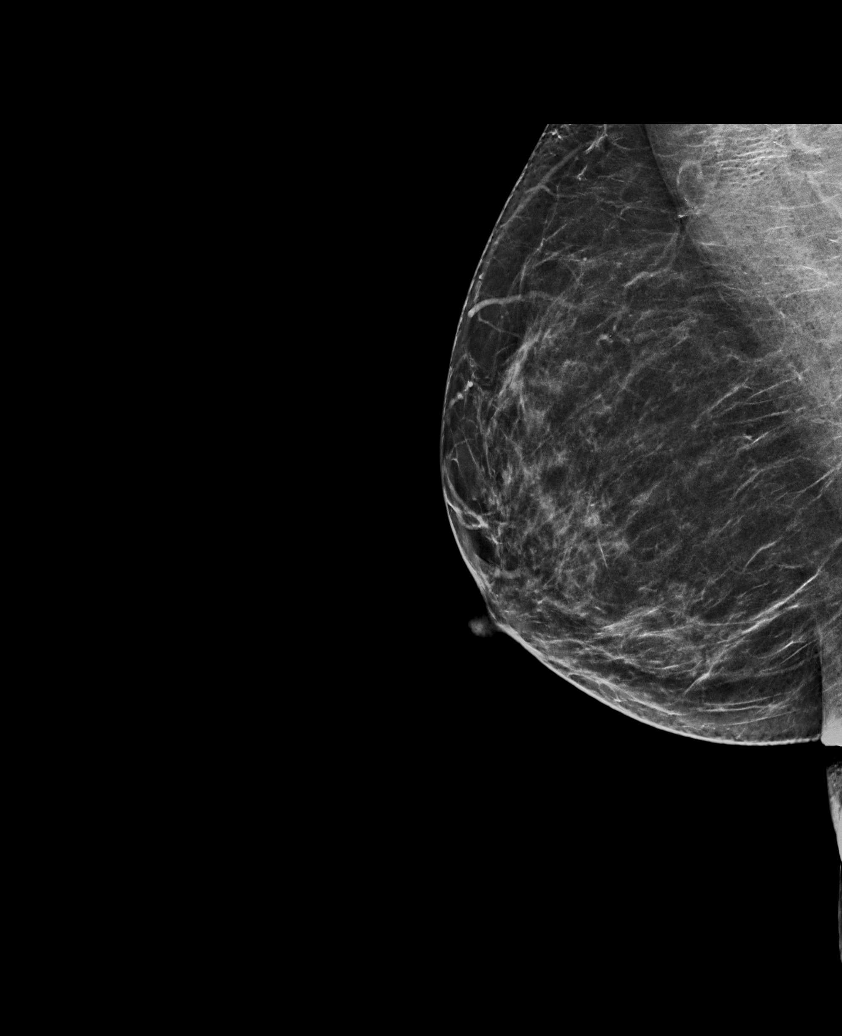

[L MLO synth-2D]
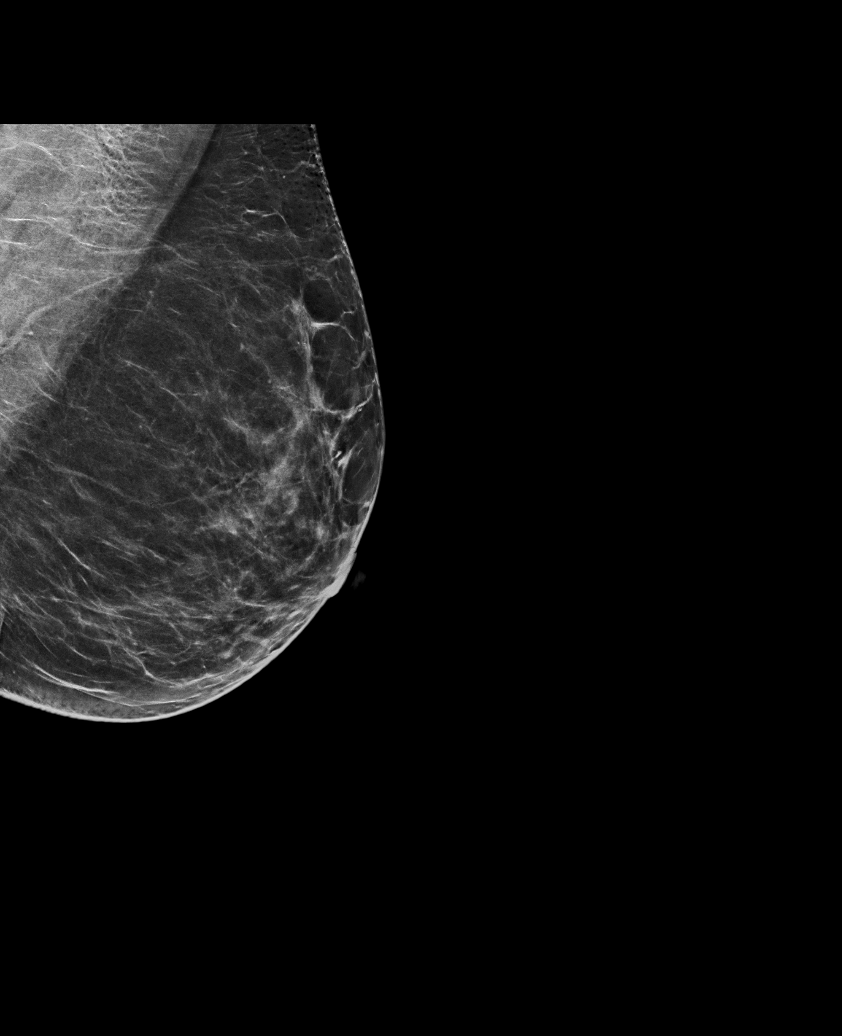

[R CC synth-2D]
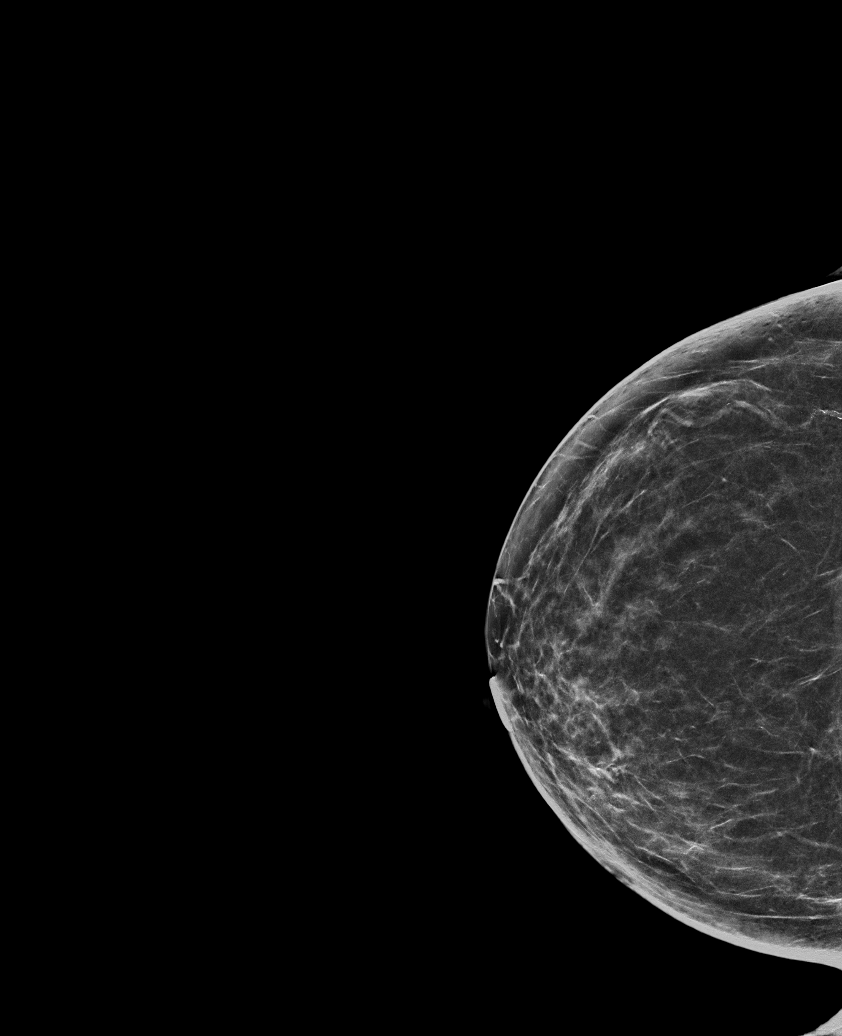

[L CC synth-2D (1 of 2)]
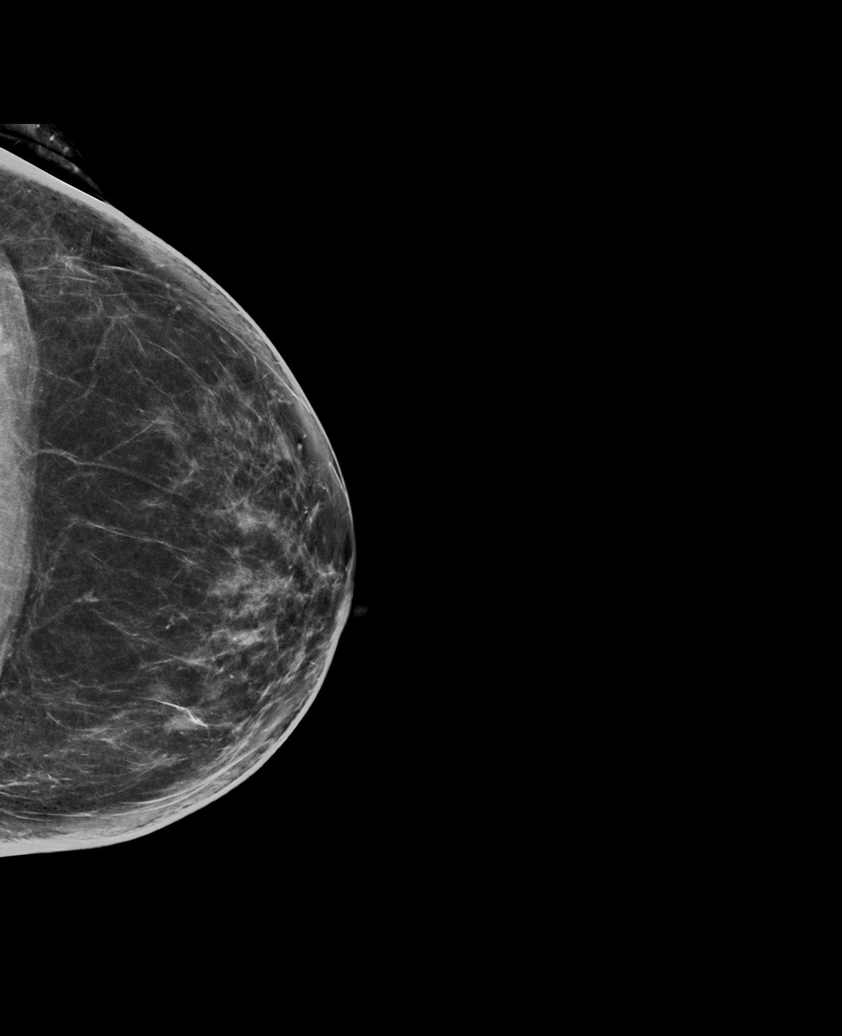

[L CC synth-2D (2 of 2)]
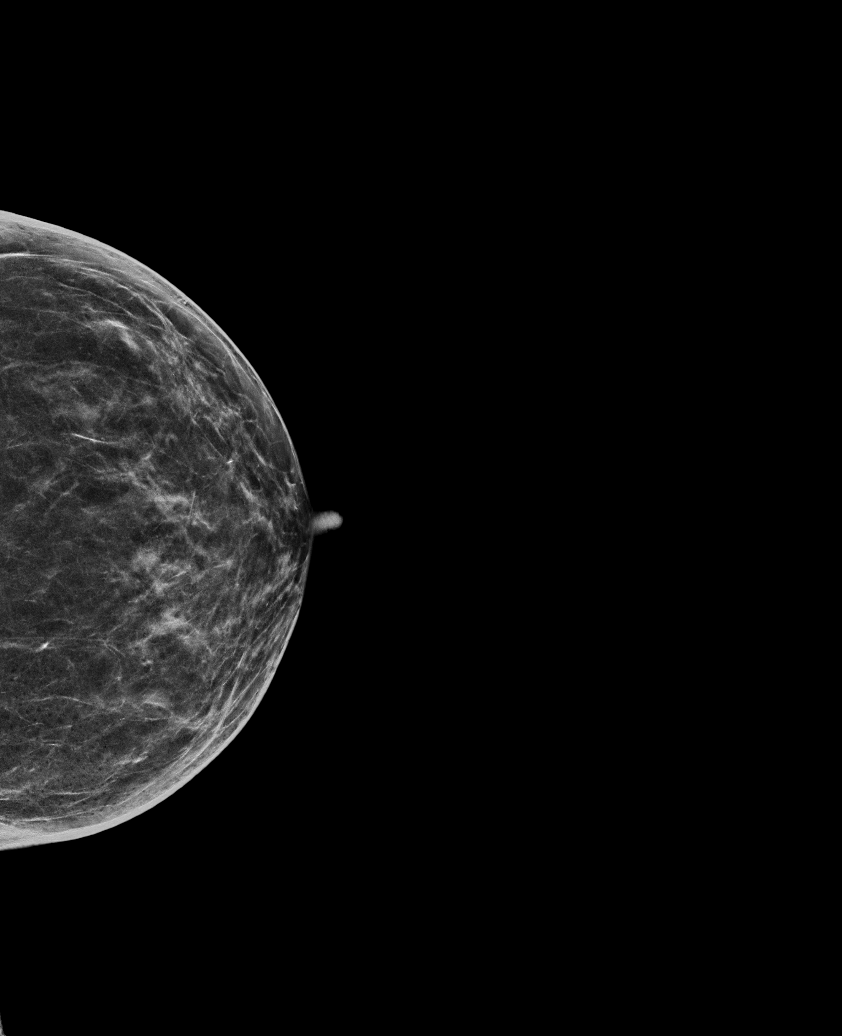

[L CC tomo · tomo slice 38/75.0]
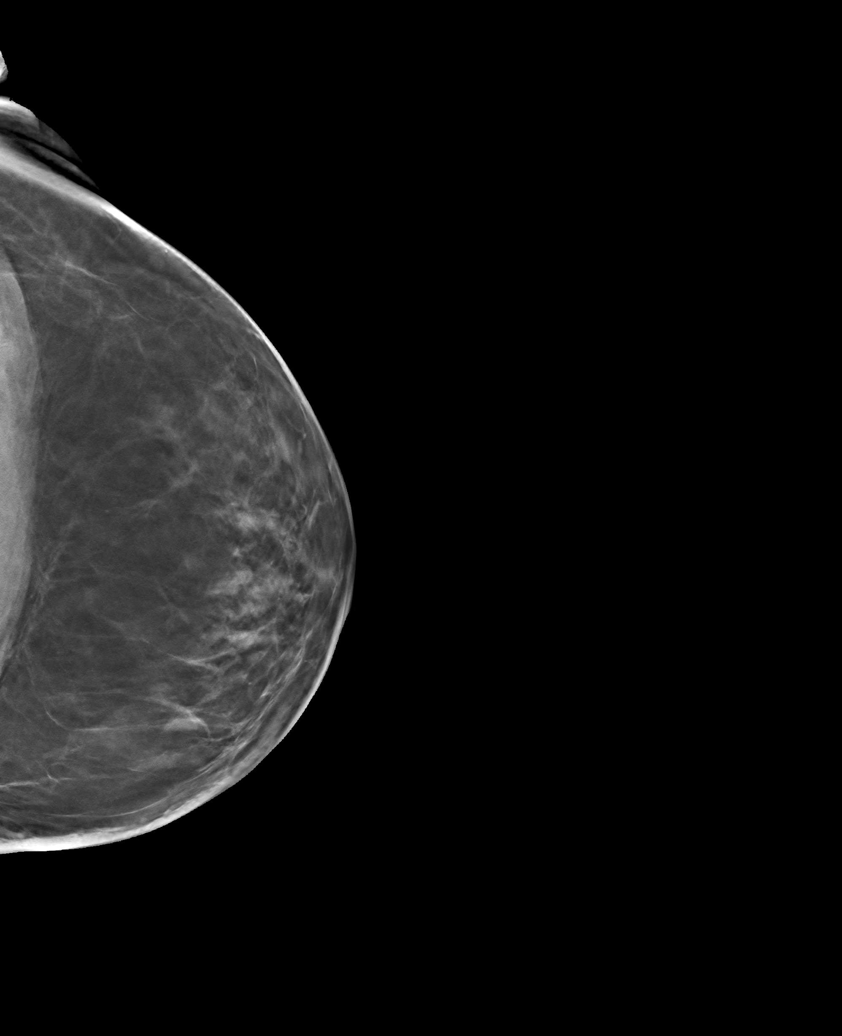

[6 of 30 positions shown; findings below may reference images not displayed]

ACR Breast Density Category b: There are scattered areas of
fibroglandular density.
FINDINGS: There are no findings suspicious for malignancy. Images were
processed with CAD.
IMPRESSION: No mammographic evidence of malignancy. A result letter of this
screening mammogram will be mailed directly to the patient.

RECOMMENDATION:
Screening mammogram in one year. (Code:Y5-G-EJ6)

BI-RADS CATEGORY  1: Negative.
